# Patient Record
Sex: Female | Born: 1955
Health system: Southern US, Community
[De-identification: ages and names within clinical notes are randomized; demographics above are authoritative.]

## PROBLEM LIST (undated history)

## (undated) DIAGNOSIS — K08409 Partial loss of teeth, unspecified cause, unspecified class: Secondary | ICD-10-CM

## (undated) HISTORY — DX: Partial loss of teeth, unspecified cause, unspecified class: K08.409

---

## 2005-09-18 ENCOUNTER — Other Ambulatory Visit: Admission: RE | Admit: 2005-09-18 | Discharge: 2005-09-18 | Payer: Self-pay | Admitting: Obstetrics and Gynecology

## 2013-07-27 ENCOUNTER — Ambulatory Visit (INDEPENDENT_AMBULATORY_CARE_PROVIDER_SITE_OTHER): Payer: BC Managed Care – PPO | Admitting: Emergency Medicine

## 2013-07-27 VITALS — BP 110/80 | HR 86 | Temp 98.0°F | Resp 16 | Ht 59.0 in | Wt 133.0 lb

## 2013-07-27 DIAGNOSIS — J018 Other acute sinusitis: Secondary | ICD-10-CM

## 2013-07-27 DIAGNOSIS — J209 Acute bronchitis, unspecified: Secondary | ICD-10-CM

## 2013-07-27 MED ORDER — PSEUDOEPHEDRINE-GUAIFENESIN ER 60-600 MG PO TB12: 1.0000 | ORAL_TABLET | Freq: Two times a day (BID) | ORAL | Status: AC

## 2013-07-27 MED ORDER — HYDROCOD POLST-CHLORPHEN POLST 10-8 MG/5ML PO LQCR: 5.0000 mL | Freq: Two times a day (BID) | ORAL | Status: DC | PRN

## 2013-07-27 MED ORDER — AMOXICILLIN-POT CLAVULANATE 875-125 MG PO TABS: 1.0000 | ORAL_TABLET | Freq: Two times a day (BID) | ORAL | Status: DC

## 2013-07-27 NOTE — Patient Instructions (Signed)

## 2013-07-27 NOTE — Progress Notes (Signed)
[facility] [address], [place] [address] [phone]  Date:  07/27/2013   Name:  Jodi Chambers   DOB:  15-Jan-1956   MRN:  [id number]  PCP:  No PCP Per Patient    Chief Complaint: Nasal Congestion   History of Present Illness:  HELYN SCHWAN is a 57 y.o. very pleasant female patient who presents with the following:  Ill since Sunday with nasal congestion and no drainage. Has some post nasal drip.  Sore throat.  Has a nonproductive cough.  No nausea or vomiting.  No wheezing or shortness of breath.  Ill contacts at work.  Myalgias and fatigue.  Malaise.  No improvement with over the counter medications or other home remedies. Denies other complaint or health concern today.   There are no active problems to display for this patient.   History reviewed. No pertinent past medical history.  Past Surgical History  Procedure Laterality Date  . Cesarean section      History  Substance Use Topics  . Smoking status: Never Smoker   . Smokeless tobacco: Not on file  . Alcohol Use: Yes    History reviewed. No pertinent family history.  No Known Allergies  Medication list has been reviewed and updated.  No current outpatient prescriptions on file prior to visit.   No current facility-administered medications on file prior to visit.    Review of Systems:  As per HPI, otherwise negative.    Physical Examination: Filed Vitals:   07/27/13 1431  BP: 110/80  Pulse: 86  Temp: 98 F (36.7 C)  Resp: 16   Filed Vitals:   07/27/13 1431  Height: 4\' 11"  (1.499 m)  Weight: 133 lb (60.328 kg)   Body mass index is 26.85 kg/(m^2). Ideal Body Weight: Weight in (lb) to have BMI = 25: 123.5  GEN: WDWN, NAD, Non-toxic, A & O x 3 HEENT: Atraumatic, Normocephalic. Neck supple. No masses, No LAD. Ears and Nose: No external deformity.  TM negative.  Purulent nasal drainage CV: RRR, No M/G/R. No JVD. No thrill. No extra heart sounds. PULM: CTA B, no  wheezes, crackles, rhonchi. No retractions. No resp. distress. No accessory muscle use. ABD: S, NT, ND, +BS. No rebound. No HSM. EXTR: No c/c/e NEURO Normal gait.  PSYCH: Normally interactive. Conversant. Not depressed or anxious appearing.  Calm demeanor.    Assessment and Plan: Sinusitis Bronchitis augmentin muncinex tussionex   Signed,  Phillips Odor, MD

## 2014-07-03 ENCOUNTER — Other Ambulatory Visit: Payer: Self-pay

## 2015-09-19 ENCOUNTER — Ambulatory Visit: Payer: Self-pay | Admitting: Physician Assistant

## 2017-03-20 DIAGNOSIS — K08409 Partial loss of teeth, unspecified cause, unspecified class: Secondary | ICD-10-CM

## 2017-03-20 HISTORY — PX: TOOTH EXTRACTION: SUR596

## 2017-03-20 HISTORY — DX: Partial loss of teeth, unspecified cause, unspecified class: K08.409

## 2017-10-23 ENCOUNTER — Ambulatory Visit (INDEPENDENT_AMBULATORY_CARE_PROVIDER_SITE_OTHER): Payer: Commercial Managed Care - PPO | Admitting: Physician Assistant

## 2017-10-23 ENCOUNTER — Ambulatory Visit (INDEPENDENT_AMBULATORY_CARE_PROVIDER_SITE_OTHER): Payer: Commercial Managed Care - PPO

## 2017-10-23 ENCOUNTER — Encounter: Payer: Self-pay | Admitting: Physician Assistant

## 2017-10-23 ENCOUNTER — Other Ambulatory Visit: Payer: Self-pay

## 2017-10-23 VITALS — BP 112/84 | HR 99 | Temp 97.8°F | Resp 16 | Ht 59.0 in | Wt 156.8 lb

## 2017-10-23 DIAGNOSIS — R05 Cough: Secondary | ICD-10-CM

## 2017-10-23 DIAGNOSIS — R059 Cough, unspecified: Secondary | ICD-10-CM

## 2017-10-23 DIAGNOSIS — R053 Chronic cough: Secondary | ICD-10-CM

## 2017-10-23 LAB — POCT CBC
Granulocyte percent: 60.2 %G (ref 37–80)
HCT, POC: 43.9 % (ref 37.7–47.9)
Hemoglobin: 14.8 g/dL (ref 12.2–16.2)
Lymph, poc: 2.7 (ref 0.6–3.4)
MCH: 32.7 pg — AB (ref 27–31.2)
MCHC: 33.6 g/dL (ref 31.8–35.4)
MCV: 97.2 fL — AB (ref 80–97)
MID (CBC): 0.2 (ref 0–0.9)
MPV: 8.2 fL (ref 0–99.8)
POC Granulocyte: 4.5 (ref 2–6.9)
POC LYMPH PERCENT: 36.7 %L (ref 10–50)
POC MID %: 3.1 %M (ref 0–12)
Platelet Count, POC: 291 10*3/uL (ref 142–424)
RBC: 4.52 M/uL (ref 4.04–5.48)
RDW, POC: 14 %
WBC: 7.4 10*3/uL (ref 4.6–10.2)

## 2017-10-23 MED ORDER — FAMOTIDINE 40 MG PO TABS: 40.0000 mg | ORAL_TABLET | Freq: Every day | ORAL | 0 refills | Status: DC

## 2017-10-23 NOTE — Patient Instructions (Addendum)
  Start the and continue the medication I have prescribed.  Please elevate the head of the bed by about 6 inches either with pillows under your mattress or encyclopedia's under the bed post. See you back in one month.     IF you received an x-ray today, you will receive an invoice from [facility]. Please contact [facility] at [phone] with questions or concerns regarding your invoice.   IF you received labwork today, you will receive an invoice from [facility]. Please contact [facility] at [phone] with questions or concerns regarding your invoice.   Our billing staff will not be able to assist you with questions regarding bills from these companies.  You will be contacted with the lab results as soon as they are available. The fastest way to get your results is to activate your My Chart account. Instructions are located on the last page of this paperwork. If you have not heard from us regarding the results in 2 weeks, please contact this office.

## 2017-10-23 NOTE — Progress Notes (Signed)
10/23/2017 11:48 AM   DOB: 1956/01/30 / MRN: [id number]  SUBJECTIVE:  Jodi Chambers is a 62 y.o. female presenting for cough that has been present for months now and tells me this gets worse after she eats. She denies dysphagia or odynophagia.  Denies weight loss. Denies any blood in the stool.  Take exedrine maybe one time a month for a HA. Denies a history of ulcer. Quit smoking 20 years ago and smoked roughly 22 years at roughly 1/2 pack.   She has No Known Allergies.   She  has a past medical history of H/O tooth extraction (03/2017).    She  reports that  has never smoked. she has never used smokeless tobacco. She reports that she drinks alcohol. She reports that she does not use drugs. She  reports that she currently engages in sexual activity. The patient  has a past surgical history that includes Cesarean section and Tooth extraction (03/2017).  Her family history is not on file.  Review of Systems  Constitutional: Negative for chills, diaphoresis and fever.  Eyes: Negative.   Respiratory: Positive for cough. Negative for hemoptysis, sputum production, shortness of breath and wheezing.   Cardiovascular: Negative for chest pain, orthopnea and leg swelling.  Gastrointestinal: Negative for abdominal pain, blood in stool, constipation, diarrhea, heartburn, melena, nausea and vomiting.  Genitourinary: Negative for flank pain.  Skin: Negative for rash.  Neurological: Negative for dizziness, sensory change, speech change, focal weakness and headaches.    The problem list and medications were reviewed and updated by myself where necessary and exist elsewhere in the encounter.   OBJECTIVE:  BP 112/84   Pulse 99   Temp 97.8 F (36.6 C)   Resp 16   Ht 4\' 11"  (1.499 m)   Wt 156 lb 12.8 oz (71.1 kg)   SpO2 98%   BMI 31.67 kg/m   Physical Exam  Constitutional: She is active.  Non-toxic appearance.  Cardiovascular: Normal rate, regular rhythm, S1 normal, S2 normal, normal  heart sounds and intact distal pulses. Exam reveals no gallop, no friction rub and no decreased pulses.  No murmur heard. Pulmonary/Chest: Effort normal. No stridor. No tachypnea. No respiratory distress. She has no wheezes. She has no rales.  Abdominal: She exhibits no distension.  Abdomen obese.   Musculoskeletal: She exhibits no edema.  Neurological: She is alert.  Skin: Skin is warm and dry. She is not diaphoretic. No pallor.    No results found for this or any previous visit (from the past 72 hour(s)).  Dg Chest 2 View  Result Date: 10/23/2017 CLINICAL DATA:  Chronic cough.  Remote smoking history. EXAM: CHEST  2 VIEW COMPARISON:  None. FINDINGS: The heart size and mediastinal contours are normal. The lungs are clear aside from possible mild central airway thickening, best seen on the lateral view. There is no hyperinflation, pneumothorax or pleural effusion. The bones appear unremarkable. IMPRESSION: Possible mild central airway thickening, suggesting bronchitis. No evidence of pneumonia. Electronically Signed   By: Carey Bullocks M.D.   On: 10/23/2017 11:37    ASSESSMENT AND PLAN:  Jodi Chambers was seen today for annual exam.  Diagnoses and all orders for this visit:  Chronic cough: Will see her back in about 1 month to check her progress. No other therapies at this time.  -     DG Chest 2 View; Future -     H. pylori breath test -     POCT CBC -  Basic Metabolic Panel -     TSH -     famotidine (PEPCID) 40 MG tablet; Take 1 tablet (40 mg total) by mouth at bedtime.    The patient is advised to call or return to clinic if she does not see an improvement in symptoms, or to seek the care of the closest emergency department if she worsens with the above plan.   Deliah BostonMichael Clark, MHS, PA-C Primary Care at [facility] 10/23/2017 11:48 AM

## 2017-10-23 NOTE — Progress Notes (Deleted)
    10/23/2017 11:45 AM   DOB: Mar 24, 1956 / MRN: [id number]  SUBJECTIVE:  Jodi Chambers is a 62 y.o. female presenting for chronic cough. This is worse She has No Known Allergies.   She  has a past medical history of H/O tooth extraction (03/2017).    She  reports that  has never smoked. she has never used smokeless tobacco. She reports that she drinks alcohol. She reports that she does not use drugs. She  reports that she currently engages in sexual activity. The patient  has a past surgical history that includes Cesarean section and Tooth extraction (03/2017).  Her family history is not on file.  ROS  The problem list and medications were reviewed and updated by myself where necessary and exist elsewhere in the encounter.   OBJECTIVE:  BP 112/84   Pulse 99   Temp 97.8 F (36.6 C)   Resp 16   Ht 4\' 11"  (1.499 m)   Wt 156 lb 12.8 oz (71.1 kg)   SpO2 98%   BMI 31.67 kg/m   Physical Exam  No results found for this or any previous visit (from the past 72 hour(s)).  Dg Chest 2 View  Result Date: 10/23/2017 CLINICAL DATA:  Chronic cough.  Remote smoking history. EXAM: CHEST  2 VIEW COMPARISON:  None. FINDINGS: The heart size and mediastinal contours are normal. The lungs are clear aside from possible mild central airway thickening, best seen on the lateral view. There is no hyperinflation, pneumothorax or pleural effusion. The bones appear unremarkable. IMPRESSION: Possible mild central airway thickening, suggesting bronchitis. No evidence of pneumonia. Electronically Signed   By: Carey BullocksWilliam  Veazey M.D.   On: 10/23/2017 11:37    ASSESSMENT AND PLAN:  Jodi Chambers was seen today for annual exam.  Diagnoses and all orders for this visit:  Chronic cough -     DG Chest 2 View; Future -     H. pylori breath test -     POCT CBC -     Basic Metabolic Panel -     TSH    The patient is advised to call or return to clinic if she does not see an improvement in symptoms, or to seek the  care of the closest emergency department if she worsens with the above plan.   Deliah BostonMichael Jade Burright, MHS, PA-C Primary Care at [facility] 10/23/2017 11:45 AM

## 2017-10-24 LAB — BASIC METABOLIC PANEL
BUN/Creatinine Ratio: 13 (ref 12–28)
BUN: 9 mg/dL (ref 8–27)
CO2: 21 mmol/L (ref 20–29)
CREATININE: 0.67 mg/dL (ref 0.57–1.00)
Calcium: 9.9 mg/dL (ref 8.7–10.3)
Chloride: 98 mmol/L (ref 96–106)
GFR calc Af Amer: 110 mL/min/{1.73_m2} (ref >59)
GFR calc non Af Amer: 96 mL/min/{1.73_m2} (ref >59)
GLUCOSE: 102 mg/dL — AB (ref 65–99)
Potassium: 4.4 mmol/L (ref 3.5–5.2)
SODIUM: 139 mmol/L (ref 134–144)

## 2017-10-24 LAB — TSH: TSH: 3.48 u[IU]/mL (ref 0.450–4.500)

## 2017-10-27 LAB — H. PYLORI BREATH TEST: H pylori Breath Test: NEGATIVE

## 2017-11-20 ENCOUNTER — Ambulatory Visit: Payer: Commercial Managed Care - PPO | Admitting: Physician Assistant

## 2017-11-20 ENCOUNTER — Other Ambulatory Visit: Payer: Self-pay

## 2017-11-20 ENCOUNTER — Encounter: Payer: Self-pay | Admitting: Physician Assistant

## 2017-11-20 VITALS — BP 130/82 | HR 96 | Resp 16 | Ht 59.0 in | Wt 156.8 lb

## 2017-11-20 DIAGNOSIS — Z13228 Encounter for screening for other metabolic disorders: Secondary | ICD-10-CM

## 2017-11-20 DIAGNOSIS — Z1239 Encounter for other screening for malignant neoplasm of breast: Secondary | ICD-10-CM

## 2017-11-20 DIAGNOSIS — Z130 Encounter for screening for diseases of the blood and blood-forming organs and certain disorders involving the immune mechanism: Secondary | ICD-10-CM

## 2017-11-20 DIAGNOSIS — Z1329 Encounter for screening for other suspected endocrine disorder: Secondary | ICD-10-CM

## 2017-11-20 DIAGNOSIS — Z1231 Encounter for screening mammogram for malignant neoplasm of breast: Secondary | ICD-10-CM

## 2017-11-20 DIAGNOSIS — Z1321 Encounter for screening for nutritional disorder: Secondary | ICD-10-CM

## 2017-11-20 DIAGNOSIS — Z1211 Encounter for screening for malignant neoplasm of colon: Secondary | ICD-10-CM | POA: Diagnosis not present

## 2017-11-20 DIAGNOSIS — Z0000 Encounter for general adult medical examination without abnormal findings: Secondary | ICD-10-CM

## 2017-11-20 MED ORDER — ZOSTER VAC RECOMB ADJUVANTED 50 MCG/0.5ML IM SUSR: 0.5000 mL | Freq: Once | INTRAMUSCULAR | 1 refills | Status: AC

## 2017-11-20 MED ORDER — PANTOPRAZOLE SODIUM 40 MG PO TBEC: 40.0000 mg | DELAYED_RELEASE_TABLET | Freq: Every day | ORAL | 3 refills | Status: DC

## 2017-11-20 NOTE — Progress Notes (Signed)
11/20/2017 11:37 AM   DOB: 1955/12/16 / MRN: [id number]  SUBJECTIVE:  Jodi Chambers is a 62 y.o. female presenting for annual exam. She had a pap smear about 1 year ago and normal. She sees [facility] for this. She did have polyps removed about ten years ago.She has not had ultrasounds of her breast yet. She denies a family history of breast cancer.  She has not had a colonoscopy. She likes to exercise via elliptical when she can.  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 She drinks white wine a few times a week. She may drink up to 3 to 4 at one time but this is rare. She lives by herself. She has son that is thirty. He is healthy and is doing well. She sleeps well at night.   She denies a history of CAD.       Depression screen PHQ 2/9 11/20/2017  Decreased Interest 0  Down, Depressed, Hopeless 0  PHQ - 2 Score 0      She has No Known Allergies.   She  has a past medical history of H/O tooth extraction (03/2017).    She  reports that she quit smoking about 20 years ago. Her smoking use included cigarettes. She has a 5.61 pack-year smoking history. she has never used smokeless tobacco. She reports that she drinks alcohol. She reports that she does not use drugs. She  reports that she currently engages in sexual activity. The patient  has a past surgical history that includes Cesarean section and Tooth extraction (03/2017).  Her family history is not on file.  Review of Systems  Constitutional: Negative for chills, diaphoresis and fever.  Respiratory: Negative for cough, hemoptysis, sputum production, shortness of breath and wheezing.   Cardiovascular: Negative for chest pain, orthopnea and leg swelling.  Gastrointestinal: Negative for nausea.  Skin: Negative for rash.  Neurological: Negative for dizziness.    The problem list and medications were reviewed and updated by myself  where necessary and exist elsewhere in the encounter.   OBJECTIVE:  BP 130/82 (BP Location: Left Arm, Patient Position: Sitting, Cuff Size: Normal)   Pulse 96   Resp 16   Ht 4\' 11"  (1.499 m)   Wt 156 lb 12.8 oz (71.1 kg)   SpO2 99%   BMI 31.67 kg/m   Physical Exam  Constitutional: She is active.  Non-toxic appearance.  Cardiovascular: Normal rate, regular rhythm, S1 normal, S2 normal, normal heart sounds and intact distal pulses. Exam reveals no gallop, no friction rub and no decreased pulses.  No murmur heard. Pulmonary/Chest: Effort normal. No stridor. No tachypnea. No respiratory distress. She has no wheezes. She has no rales.  Abdominal: She exhibits no distension.  Musculoskeletal: She exhibits no edema.  Neurological: She is alert.  Skin: Skin is warm and dry. She is not diaphoretic. No pallor.    No results found for this or any previous visit (from the past 72 hour(s)).  No results found.  ASSESSMENT AND PLAN:  Jodi Chambers was seen today for cough.  Diagnoses and all orders for this visit:  Annual physical exam  Screening for breast cancer -     MM DIGITAL SCREENING BILATERAL; Future  Special screening for malignant neoplasms, colon -     Cologuard  Screening for endocrine, nutritional, metabolic and immunity disorder -  Hemoglobin A1c -     Lipid Panel  Other orders -     pantoprazole (PROTONIX) 40 MG tablet; Take 1 tablet (40 mg total) by mouth daily. -     Zoster Vaccine Adjuvanted [facility]) injection; Inject 0.5 mLs into the muscle once for 1 dose. Due for booster in 6 months.    The patient is advised to call or return to clinic if she does not see an improvement in symptoms, or to seek the care of the closest emergency department if she worsens with the above plan.   Deliah Boston, MHS, PA-C Primary Care at [facility] 11/20/2017 11:37 AM

## 2017-11-20 NOTE — Patient Instructions (Addendum)
Take the pantoprazole 30 minutes before breakfast.  Okay to stop the famotidine in the next 3 or 4 days as the pantoprazole will take over the action of the famotidine at night.  If your cough continues despite taking pantoprazole to let start taking Zyrtec in about 4 weeks to see if this makes an impact in your symptoms.  If in 6 weeks you come back and you are still coughing and it is unclear why I will refer you to a pulmonologist.  With regard to your annual exam we are screening your A1c for diabetes, your lipids or cholesterol.  Please complete your Cologaurd.  Please get your mammogram.      IF you received an x-ray today, you will receive an invoice from [facility]. Please contact [facility] at [phone] with questions or concerns regarding your invoice.   IF you received labwork today, you will receive an invoice from [facility]. Please contact [facility] at [phone] with questions or concerns regarding your invoice.   Our billing staff will not be able to assist you with questions regarding bills from these companies.  You will be contacted with the lab results as soon as they are available. The fastest way to get your results is to activate your My Chart account. Instructions are located on the last page of this paperwork. If you have not heard from us regarding the results in 2 weeks, please contact this office.

## 2017-11-21 ENCOUNTER — Encounter: Payer: Self-pay | Admitting: Physician Assistant

## 2017-11-21 LAB — HEMOGLOBIN A1C
Est. average glucose Bld gHb Est-mCnc: 108 mg/dL
Hgb A1c MFr Bld: 5.4 % (ref 4.8–5.6)

## 2017-11-21 LAB — LIPID PANEL
CHOL/HDL RATIO: 4 ratio (ref 0.0–4.4)
CHOLESTEROL TOTAL: 260 mg/dL — AB (ref 100–199)
HDL: 65 mg/dL (ref >39)
LDL CALC: 160 mg/dL — AB (ref 0–99)
TRIGLYCERIDES: 174 mg/dL — AB (ref 0–149)
VLDL Cholesterol Cal: 35 mg/dL (ref 5–40)

## 2017-11-21 NOTE — Progress Notes (Signed)
Sherese please send her a letter.  Her labs look great.  There is some mild abnormalities in her lipids but her ASCVD score is too low to start her on cholesterol medication.

## 2018-06-20 IMAGING — DX DG CHEST 2V
2 series · 2 of 2 positions shown · non-contrast
Comparison: None.

CLINICAL DATA: Chronic cough.  Remote smoking history.

EXAM:
CHEST  2 VIEW

[chest pa]
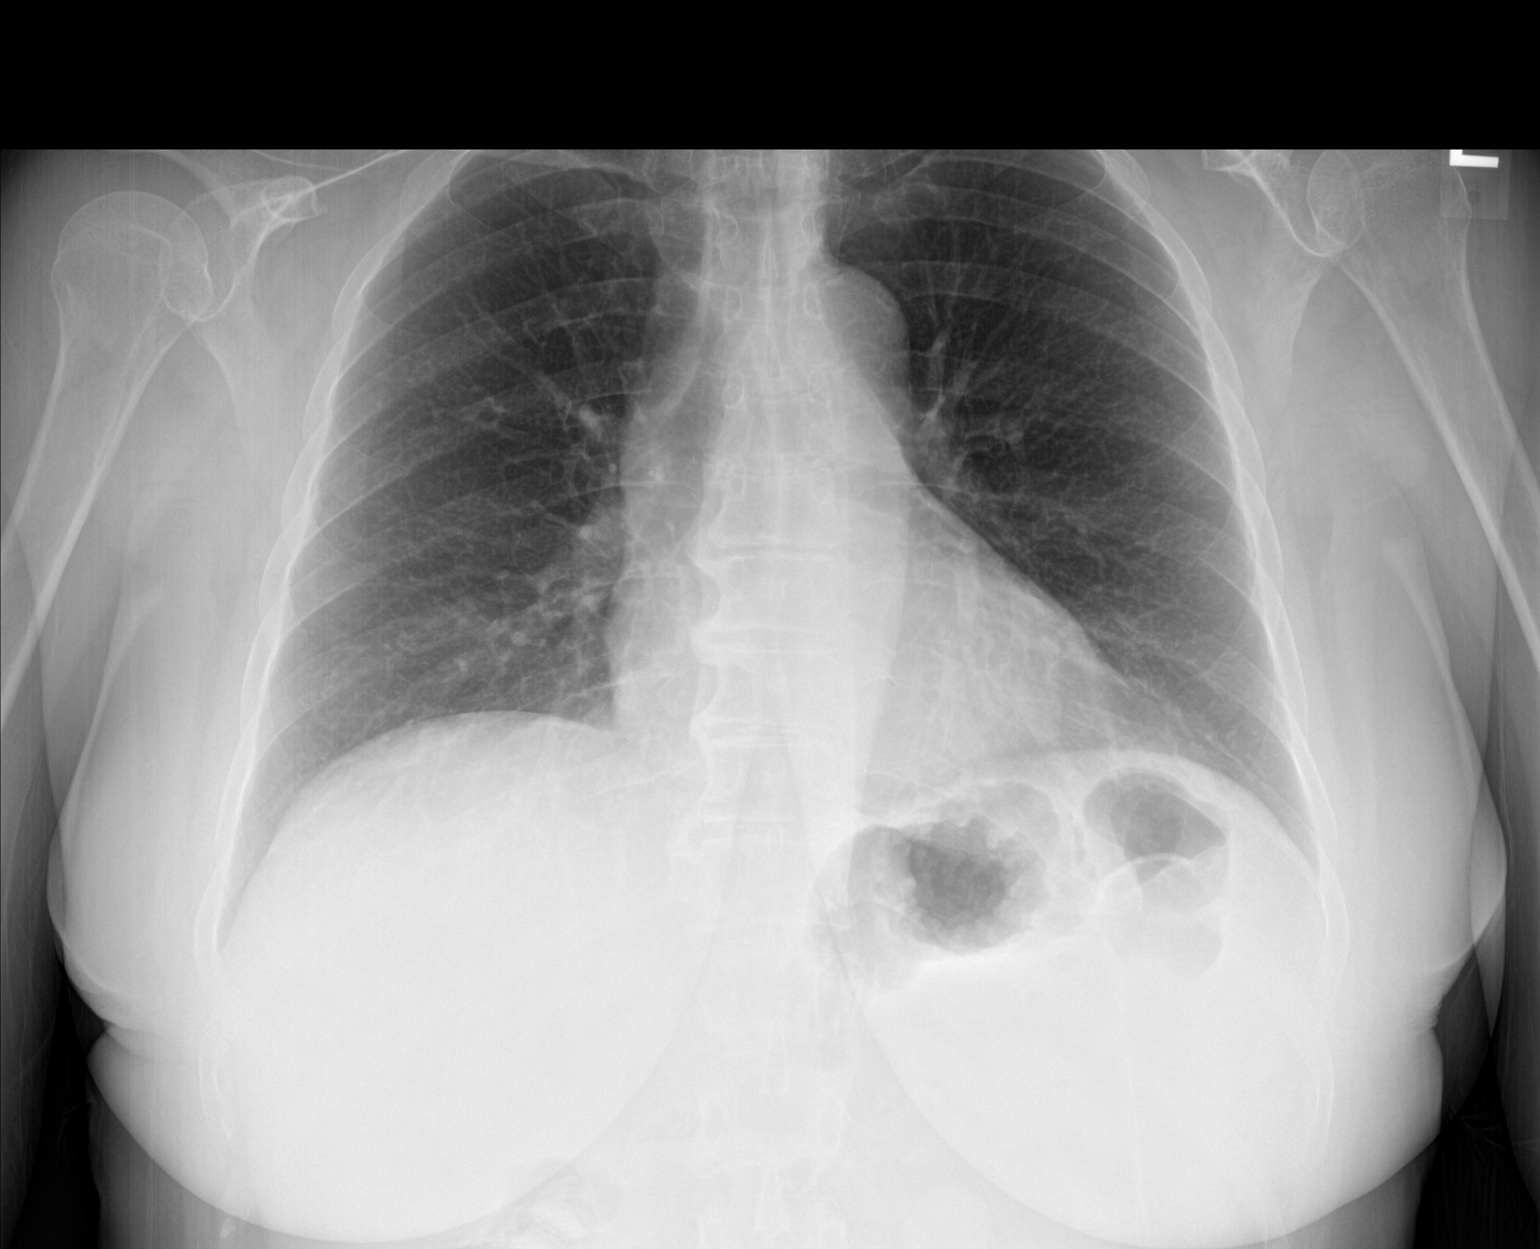

[chest lat]
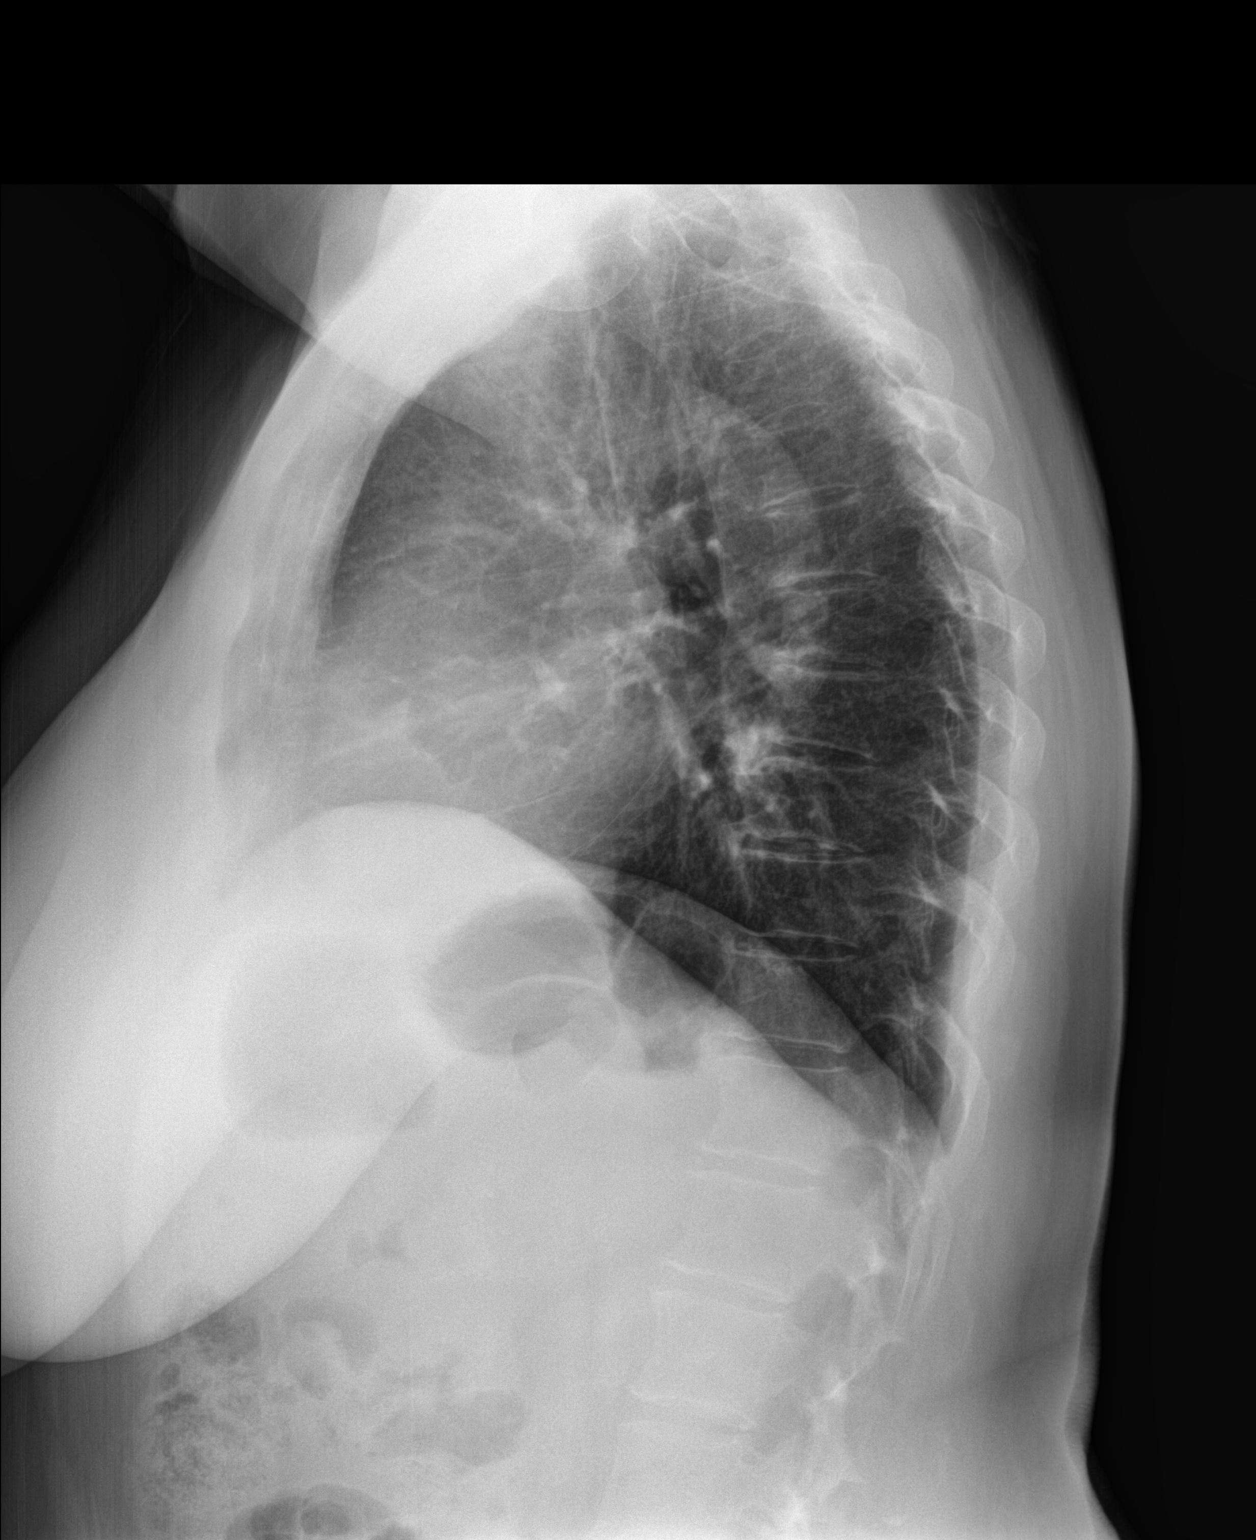

[2 of 2 positions shown; findings below may reference images not displayed]

FINDINGS: The heart size and mediastinal contours are normal. The lungs are
clear aside from possible mild central airway thickening, best seen
on the lateral view. There is no hyperinflation, pneumothorax or
pleural effusion. The bones appear unremarkable.
IMPRESSION: Possible mild central airway thickening, suggesting bronchitis. No
evidence of pneumonia.

## 2022-10-10 DIAGNOSIS — Z6832 Body mass index (BMI) 32.0-32.9, adult: Secondary | ICD-10-CM | POA: Diagnosis not present

## 2022-10-10 DIAGNOSIS — R6889 Other general symptoms and signs: Secondary | ICD-10-CM | POA: Diagnosis not present

## 2022-10-17 DIAGNOSIS — Z6831 Body mass index (BMI) 31.0-31.9, adult: Secondary | ICD-10-CM | POA: Diagnosis not present

## 2022-10-17 DIAGNOSIS — R6889 Other general symptoms and signs: Secondary | ICD-10-CM | POA: Diagnosis not present

## 2022-12-17 DIAGNOSIS — R399 Unspecified symptoms and signs involving the genitourinary system: Secondary | ICD-10-CM | POA: Diagnosis not present

## 2022-12-17 DIAGNOSIS — Z6831 Body mass index (BMI) 31.0-31.9, adult: Secondary | ICD-10-CM | POA: Diagnosis not present

## 2022-12-17 DIAGNOSIS — R030 Elevated blood-pressure reading, without diagnosis of hypertension: Secondary | ICD-10-CM | POA: Diagnosis not present

## 2023-01-01 DIAGNOSIS — R399 Unspecified symptoms and signs involving the genitourinary system: Secondary | ICD-10-CM | POA: Diagnosis not present

## 2023-03-12 DIAGNOSIS — R399 Unspecified symptoms and signs involving the genitourinary system: Secondary | ICD-10-CM | POA: Diagnosis not present

## 2023-03-19 DIAGNOSIS — R102 Pelvic and perineal pain: Secondary | ICD-10-CM | POA: Diagnosis not present

## 2023-03-19 DIAGNOSIS — Z6829 Body mass index (BMI) 29.0-29.9, adult: Secondary | ICD-10-CM | POA: Diagnosis not present

## 2023-04-22 ENCOUNTER — Ambulatory Visit: Payer: PPO | Admitting: Obstetrics and Gynecology

## 2023-04-22 ENCOUNTER — Other Ambulatory Visit (HOSPITAL_COMMUNITY)
Admission: RE | Admit: 2023-04-22 | Discharge: 2023-04-22 | Disposition: A | Discharge status: Home / Self Care | Payer: PPO | Source: Other Acute Inpatient Hospital | Attending: Obstetrics and Gynecology | Admitting: Obstetrics and Gynecology

## 2023-04-22 ENCOUNTER — Encounter: Payer: Self-pay | Admitting: Obstetrics and Gynecology

## 2023-04-22 VITALS — BP 164/96 | HR 83 | Ht <= 58 in | Wt 145.6 lb

## 2023-04-22 DIAGNOSIS — N8110 Cystocele, unspecified: Secondary | ICD-10-CM

## 2023-04-22 DIAGNOSIS — N952 Postmenopausal atrophic vaginitis: Secondary | ICD-10-CM

## 2023-04-22 DIAGNOSIS — R350 Frequency of micturition: Secondary | ICD-10-CM | POA: Diagnosis not present

## 2023-04-22 DIAGNOSIS — N393 Stress incontinence (female) (male): Secondary | ICD-10-CM

## 2023-04-22 DIAGNOSIS — R82998 Other abnormal findings in urine: Secondary | ICD-10-CM | POA: Diagnosis not present

## 2023-04-22 LAB — POCT URINALYSIS DIPSTICK
Bilirubin, UA: NEGATIVE
Blood, UA: NEGATIVE
Glucose, UA: NEGATIVE
Ketones, UA: NEGATIVE
Nitrite, UA: NEGATIVE
Protein, UA: NEGATIVE
Spec Grav, UA: 1.02 (ref 1.010–1.025)
Urobilinogen, UA: 0.2 E.U./dL
pH, UA: 7 (ref 5.0–8.0)

## 2023-04-22 MED ORDER — ESTRADIOL 0.1 MG/GM VA CREA
0.5000 g | TOPICAL_CREAM | VAGINAL | 11 refills | Status: AC
Start: 2023-04-23 — End: ?

## 2023-04-22 NOTE — Patient Instructions (Addendum)
You have stage 1 out of 4 Anterior vaginal prolapse.   Information on pessary included here. Consider   Start vaginal estrogen cream nightly for two weeks and then twice weekly after. Use a blueberry sized amount onto the finger and into the vagina and around the clitoris and urethra.   Information on urethral bulking included here as well.

## 2023-04-22 NOTE — Progress Notes (Signed)
[facility] New Patient Evaluation and Consultation  Referring Provider: Inez Pilgrim, NP PCP: Jodi Pilgrim, NP Date of Service: 04/22/2023  SUBJECTIVE Chief Complaint: New Patient (Initial Visit) Jodi Chambers is a 67 y.o. female is here for prolapse)  History of Present Illness: Jodi Chambers is a 67 y.o. White or Caucasian female seen in consultation at the request of Dr. Sallee Chambers for evaluation of pelvic organ prolapse and recurrent UTI.    Review of records significant for: Taking D-mannose   Urinary Symptoms: Does not leak urine.    Day time voids 4.  Nocturia: 1-2 times per night to void. Voiding dysfunction: she empties her bladder well.  does not use a catheter to empty bladder.  When urinating, she feels dribbling after finishing Drinks: 2 cups of coffee in AM, Prune juice, and 4-6 cups Water per day  UTIs: 4 UTI's in the last year.   Denies history of blood in urine, kidney or bladder stones, pyelonephritis, bladder cancer, and kidney cancer  Pelvic Organ Prolapse Symptoms:                  She Admits to a feeling of a bulge the vaginal area sometimes. It has been present for 6 months.  She Denies seeing a bulge.  This bulge is not bothersome.  Bowel Symptom: Bowel movements: 1 time(s) per day Stool consistency: soft  Straining: yes.  Splinting: no.  Incomplete evacuation: yes.  She Denies accidental bowel leakage / fecal incontinence Bowel regimen: fiber Last colonoscopy: Never  Sexual Function Sexually active: no.  Sexual orientation: Straight Pain with sex: No  Pelvic Pain Denies pelvic pain    Past Medical History:  Past Medical History:  Diagnosis Date   H/O tooth extraction 03/2017     Past Surgical History:   Past Surgical History:  Procedure Laterality Date   CESAREAN SECTION     TOOTH EXTRACTION  03/2017     Past OB/GYN History: G1 P1 Vaginal deliveries: 0,  Forceps/ Vacuum deliveries: 0, Cesarean  section: 1 Menopausal: Yes, at age 68 Last pap smear was 2008.  Any history of abnormal pap smears: no.   Medications: She has a current medication list which includes the following prescription(s): d-mannose, [START ON 04/23/2023] estradiol, ergocalciferol, multi for her, and oyster shell calcium.   Allergies: Patient has No Known Allergies.   Social History:  Social History   Tobacco Use   Smoking status: Former    Packs/day: 0.33    Years: 17.00    Additional pack years: 0.00    Total pack years: 5.61    Types: Cigarettes    Quit date: 10/20/1997    Years since quitting: 25.5   Smokeless tobacco: Never  Vaping Use   Vaping Use: Never used  Substance Use Topics   Alcohol use: Yes    Alcohol/week: 2.0 standard drinks of alcohol    Types: 2 Glasses of wine per week   Drug use: No    Relationship status: single She lives alone.   She is employed in administration. Regular exercise: Yes: walking and gym History of abuse: No  Family History:   Family History  Problem Relation Age of Onset   Heart disease Mother    Dementia Father      Review of Systems: Review of Systems  Constitutional:  Negative for chills, fever and malaise/fatigue.  Respiratory:  Positive for cough. Negative for shortness of breath and wheezing.   Gastrointestinal:  Negative for abdominal pain,  blood in stool and constipation.  Genitourinary:  Positive for frequency. Negative for dysuria and urgency.  Musculoskeletal:  Negative for myalgias.  Neurological:  Negative for dizziness, seizures and weakness.  Psychiatric/Behavioral:  Negative for depression and suicidal ideas. The patient is not nervous/anxious.      OBJECTIVE Physical Exam: Vitals:   04/22/23 1050 04/22/23 1056  BP: (!) 162/94 (!) 164/96  Pulse: 94 83  Weight: 145 lb 9.6 oz (66 kg)   Height: 4\' 10"  (1.473 m)     Physical Exam Constitutional:      Appearance: Normal appearance.  Pulmonary:     Effort: Pulmonary effort is  normal.  Abdominal:     General: Abdomen is flat.  Skin:    General: Skin is warm and dry.  Neurological:     Mental Status: She is alert.  Psychiatric:        Mood and Affect: Mood normal.        Behavior: Behavior normal.        Thought Content: Thought content normal.        Judgment: Judgment normal.      GU / Detailed Urogynecologic Evaluation:  Pelvic Exam: Normal external female genitalia; Bartholin's and Skene's glands normal in appearance; urethral meatus normal in appearance, no urethral masses or discharge.   CST: positive  Speculum exam reveals normal vaginal mucosa with atrophy. Cervix normal appearance. Uterus normal single, nontender. Adnexa normal adnexa.      With apex supported, anterior compartment defect was reduced  Pelvic floor strength I/V  Pelvic floor musculature: Right levator non-tender, Right obturator non-tender, Left levator non-tender, Left obturator non-tender  POP-Q:   POP-Q  -1.5                                            Aa   -1.5                                           Ba  -6                                              C   3.5                                            Gh  4                                            Pb  7.5                                            tvl   -2  Ap  -2                                            Bp  6.5                                              D      Rectal Exam:  Normal external rectal exam.  Post-Void Residual (PVR) by Bladder Scan: In order to evaluate bladder emptying, we discussed obtaining a postvoid residual and she agreed to this procedure.  Procedure: The ultrasound unit was placed on the patient's abdomen in the suprapubic region after the patient had voided. A PVR of 91 ml was obtained by bladder scan.  Laboratory Results: POC Urine: Positive for moderate leukocytes, negative for all other components.   ASSESSMENT AND  PLAN Jodi Chambers is a 67 y.o. with:  1. Prolapse of anterior vaginal wall   2. SUI (stress urinary incontinence, female)   3. Vaginal atrophy   4. Leukocytes in urine   5. Urinary frequency    Patient has stage I/IV anterior vaginal wall prolapse. We discussed that if she is not symptomatic of her prolapse she does not have to consider surgical options. She reports sometimes feeling that she does not empty her bladder well. We discussed that we could do a pessary that may assist her in emptying her bladder better. She is mildly interested in a pessary but would like to consider it. Information provided to patient.  Patient did have a positive CST on exam today. She reports she is not overly bothered by this, but we did discuss treatment options so that she knows she has choices for treating leakage. We discussed surgical sling and urethral bulking. She was slightly interested in bulking, so information was given for this. Patient had some moderate vaginal atrophy on exam. As she has had a few UTI's in the past year, encouraged her to start on vaginal estrogen cream. She is open to this idea. Prescription for estrace sent to pharmacy for patient. She is to use it nightly for 2 weeks, then twice a week after. Patient instructed not to use the applicator but to use a blueberry sized amount onto her finger and apply it into the the vagina. We also discussed this is a first line defense against UTI.  Patient has leukocytes with urine. Will send for culture.  OAB symptoms are mild and not bothersome to patients.   Patient to follow up PRN and will call if she wants to pursue pessary insertion or urethral bulking.     Selmer Dominion, NP

## 2023-04-23 LAB — URINE CULTURE: Culture: <10000 — AB

## 2023-05-12 ENCOUNTER — Encounter: Payer: PPO | Admitting: Physical Therapy

## 2023-06-24 NOTE — Therapy (Deleted)
OUTPATIENT PHYSICAL THERAPY FEMALE PELVIC EVALUATION   Patient Name: Jodi Chambers MRN: [id number] DOB:08-Jan-1956, 67 y.o., female Today's Date: 06/24/2023  END OF SESSION:   Past Medical History:  Diagnosis Date   H/O tooth extraction 03/2017   Past Surgical History:  Procedure Laterality Date   CESAREAN SECTION     TOOTH EXTRACTION  03/2017   There are no problems to display for this patient.   PCP: Inez Pilgrim, NP  REFERRING PROVIDER: Selmer Dominion, NP   REFERRING DIAG:  R35.0 (ICD-10-CM) - Urinary frequency  N81.10 (ICD-10-CM) - Prolapse of anterior vaginal wall    THERAPY DIAG:  No diagnosis found.  Rationale for Evaluation and Treatment: Rehabilitation  ONSET DATE: ***  SUBJECTIVE:                                                                                                                                                                                           SUBJECTIVE STATEMENT: 4 UTI's last year Fluid intake:  2 cups of coffee in AM, Prune juice, and 4-6 cups Water per day    PAIN:  Are you having pain? {yes/no:20286} NPRS scale: ***/10 Pain location: {pelvic pain location:27098}  Pain type: {type:313116} Pain description: {PAIN DESCRIPTION:21022940}   Aggravating factors: *** Relieving factors: ***  PRECAUTIONS: {Therapy precautions:24002}  RED FLAGS: {PT Red Flags:29287}   WEIGHT BEARING RESTRICTIONS: {Yes ***/No:24003}  FALLS:  Has patient fallen in last 6 months? {fallsyesno:27318}  LIVING ENVIRONMENT: Lives with: {[facility] lives with:25569::"lives with their family"} Lives in: {Lives in:25570} Stairs: {opstairs:27293} Has following equipment at home: {Assistive devices:23999}  OCCUPATION: ***  PLOF: {PLOF:24004}  PATIENT GOALS: ***  PERTINENT HISTORY:  Cesarean section Sexual abuse: {Yes/No:304960894}  BOWEL MOVEMENT: Pain with bowel movement: {yes/no:20286} Type of bowel movement:Type (Bristol Stool Scale)  ***, Frequency ***, Strain Yes, and Splinting *** Fully empty rectum: No Leakage: {Yes/No:304960894} Pads: {Yes/No:304960894} Fiber supplement: {Yes/No:304960894}  URINATION: Pain with urination: {yes/no:20286} Fully empty bladder: When urinating, she feels dribbling after finishing  Stream: {PT urination:27102} Urgency: {Yes/No:304960894} Frequency: *** Leakage: {PT leakage:27103} Pads: {Yes/No:304960894}  INTERCOURSE:Not active Pain with intercourse: {pain with intercourse PA:27099} Ability to have vaginal penetration:  {Yes/No:304960894} Climax: *** Marinoff Scale: ***/3  PREGNANCY: Vaginal deliveries *** Tearing {Yes***/No:304960894} C-section deliveries 1 Currently pregnant {Yes***/No:304960894}  PROLAPSE: Cystocele stage 1   OBJECTIVE:   DIAGNOSTIC FINDINGS:  Pelvic floor strength I/V  PVR of 91 ml was obtained by bladder scan.  Positive CST  PATIENT SURVEYS:  {rehab surveys:24030}  PFIQ-7 ***  COGNITION: Overall cognitive status: {cognition:24006}     SENSATION: Light touch: {intact/deficits:24005} Proprioception: {intact/deficits:24005}  MUSCLE LENGTH: Hamstrings: Right ***  deg; Left *** deg Maisie Fus test: Right *** deg; Left *** deg  LUMBAR SPECIAL TESTS:  {lumbar special test:25242}  FUNCTIONAL TESTS:  {Functional tests:24029}  GAIT: Distance walked: *** Assistive device utilized: {Assistive devices:23999} Level of assistance: {Levels of assistance:24026} Comments: ***  POSTURE: {posture:25561}  PELVIC ALIGNMENT:  LUMBARAROM/PROM:  A/PROM A/PROM  eval  Flexion   Extension   Right lateral flexion   Left lateral flexion   Right rotation   Left rotation    (Blank rows = not tested)  LOWER EXTREMITY ROM:  {AROM/PROM:27142} ROM Right eval Left eval  Hip flexion    Hip extension    Hip abduction    Hip adduction    Hip internal rotation    Hip external rotation    Knee flexion    Knee extension    Ankle dorsiflexion     Ankle plantarflexion    Ankle inversion    Ankle eversion     (Blank rows = not tested)  LOWER EXTREMITY MMT:  MMT Right eval Left eval  Hip flexion    Hip extension    Hip abduction    Hip adduction    Hip internal rotation    Hip external rotation    Knee flexion    Knee extension    Ankle dorsiflexion    Ankle plantarflexion    Ankle inversion    Ankle eversion     PALPATION:   General  ***                External Perineal Exam ***                             Internal Pelvic Floor ***  Patient confirms identification and approves PT to assess internal pelvic floor and treatment {yes/no:20286}  PELVIC MMT:   MMT eval  Vaginal   Internal Anal Sphincter   External Anal Sphincter   Puborectalis   Diastasis Recti   (Blank rows = not tested)        TONE: ***  PROLAPSE: ***  TODAY'S TREATMENT:                                                                                                                              DATE: ***  EVAL ***   PATIENT EDUCATION:  Education details: *** Person educated: {Person educated:25204} Education method: {Education Method:25205} Education comprehension: {Education Comprehension:25206}  HOME EXERCISE PROGRAM: ***  ASSESSMENT:  CLINICAL IMPRESSION: Patient is a *** y.o. *** who was seen today for physical therapy evaluation and treatment for ***.   OBJECTIVE IMPAIRMENTS: {opptimpairments:25111}.   ACTIVITY LIMITATIONS: {activitylimitations:27494}  PARTICIPATION LIMITATIONS: {participationrestrictions:25113}  PERSONAL FACTORS: {Personal factors:25162} are also affecting patient's functional outcome.   REHAB POTENTIAL: {rehabpotential:25112}  CLINICAL DECISION MAKING: {clinical decision making:25114}  EVALUATION COMPLEXITY: {Evaluation complexity:25115}   GOALS: Goals reviewed with patient? {yes/no:20286}  SHORT TERM GOALS: Target date: ***  ***  Baseline: Goal status: INITIAL  2.  *** Baseline:   Goal status: INITIAL  3.  *** Baseline:  Goal status: INITIAL  4.  *** Baseline:  Goal status: INITIAL  5.  *** Baseline:  Goal status: INITIAL  6.  *** Baseline:  Goal status: INITIAL  LONG TERM GOALS: Target date: ***  *** Baseline:  Goal status: INITIAL  2.  *** Baseline:  Goal status: INITIAL  3.  *** Baseline:  Goal status: INITIAL  4.  *** Baseline:  Goal status: INITIAL  5.  *** Baseline:  Goal status: INITIAL  6.  *** Baseline:  Goal status: INITIAL  PLAN:  PT FREQUENCY: {rehab frequency:25116}  PT DURATION: {rehab duration:25117}  PLANNED INTERVENTIONS: {rehab planned interventions:25118::"Therapeutic exercises","Therapeutic activity","Neuromuscular re-education","Balance training","Gait training","Patient/Family education","Self Care","Joint mobilization"}  PLAN FOR NEXT SESSION: ***   Rashema Seawright, PT 06/24/2023, 9:01 AM

## 2023-06-25 ENCOUNTER — Ambulatory Visit: Payer: PPO | Attending: Obstetrics and Gynecology | Admitting: Physical Therapy

## 2023-06-25 ENCOUNTER — Encounter: Payer: PPO | Admitting: Physical Therapy

## 2023-06-25 ENCOUNTER — Encounter: Payer: Self-pay | Admitting: Physical Therapy

## 2023-06-25 ENCOUNTER — Other Ambulatory Visit: Payer: Self-pay

## 2023-06-25 DIAGNOSIS — M6281 Muscle weakness (generalized): Secondary | ICD-10-CM | POA: Diagnosis not present

## 2023-06-25 DIAGNOSIS — R350 Frequency of micturition: Secondary | ICD-10-CM | POA: Diagnosis not present

## 2023-06-25 DIAGNOSIS — N8110 Cystocele, unspecified: Secondary | ICD-10-CM | POA: Insufficient documentation

## 2023-06-25 NOTE — Therapy (Signed)
OUTPATIENT PHYSICAL THERAPY FEMALE PELVIC EVALUATION   Patient Name: Jodi Chambers MRN: [id number] DOB:[birth year], 67 y.o., female Today's Date: 06/25/2023  END OF SESSION:  PT End of Session - 06/25/23 1403     Visit Number 1    Date for PT Re-Evaluation 08/20/23    Authorization Type healthte[employer]    Authorization - Visit Number 1    Authorization - Number of Visits 10    PT Start Time 1400    PT Stop Time 1445    PT Time Calculation (min) 45 min    Activity Tolerance Patient tolerated treatment well    Behavior During Therapy [facility] for tasks assessed/performed             Past Medical History:  Diagnosis Date   H/O tooth extraction 03/2017   Past Surgical History:  Procedure Laterality Date   CESAREAN SECTION     TOOTH EXTRACTION  03/2017   There are no problems to display for this patient.   PCP: Inez Pilgrim, NP  REFERRING PROVIDER: Selmer Dominion, NP   REFERRING DIAG:  R35.0 (ICD-10-CM) - Urinary frequency  N81.10 (ICD-10-CM) - Prolapse of anterior vaginal wall    THERAPY DIAG:  Muscle weakness (generalized)  Prolapse of anterior vaginal wall  Rationale for Evaluation and Treatment: Rehabilitation  ONSET DATE: 12/23  SUBJECTIVE:                                                                                                                                                                                           SUBJECTIVE STATEMENT: 4 UTI's last year and found out she had stage 1 prolapse. I was feeling pressure when I had to go to the bathroom but not now. I have been using the Estrace cream.  Fluid intake:  2 cups of coffee in AM, Prune juice, and 4-6 cups Water per day    PAIN:  Are you having pain? No   PRECAUTIONS: None  RED FLAGS: None   WEIGHT BEARING RESTRICTIONS: No  FALLS:  Has patient fallen in last 6 months? No  LIVING ENVIRONMENT: Lives with: lives alone  OCCUPATION: work from home; walk daily  PLOF:  Independent  PATIENT GOALS: how to exercise and manage a prolapse.   PERTINENT HISTORY:  Cesarean section  BOWEL MOVEMENT:Managing stool with prunes and fiber  URINATION: Pain with urination: No Fully empty bladder: When urinating, she feels dribbling after finishing  Stream: Strong Urgency: No Frequency: urinates 2-3 hours Leakage: Coughing  INTERCOURSE:Not active   PREGNANCY: C-section deliveries 1  PROLAPSE: Cystocele stage 1   OBJECTIVE:   DIAGNOSTIC FINDINGS:  Pelvic floor strength  I/V  PVR of 91 ml was obtained by bladder scan.  Positive CST   COGNITION: Overall cognitive status: Within functional limits for tasks assessed     SENSATION: Light touch: Appears intact Proprioception: Appears intact   POSTURE: No Significant postural limitations  PELVIC ALIGNMENT:  LUMBARAROM/PROM: full lumbar ROM   LOWER EXTREMITY ROM: full bilateral hip ROM   LOWER EXTREMITY MMT:  MMT Right eval Left eval  Hip extension 4/5 4/5   PALPATION:   General  lower abdominal scar are restricted, difficulty with contracting the lower abdominals.                 External Perineal Exam intact                             Internal Pelvic Floor tightness on the sides of the introitus  Patient confirms identification and approves PT to assess internal pelvic floor and treatment Yes  PELVIC MMT:   MMT eval  Vaginal 2/5 no hug of therapist finger and tightness on the lateral sides  (Blank rows = not tested)        TONE: average  PROLAPSE: Mild anterior wall weakness  TODAY'S TREATMENT:                                                                                                                              DATE: 06/25/23  EVAL See below   PATIENT EDUCATION:  06/25/23 Education details: Access Code: [email or web address] Person educated: Patient Education method: Explanation, Demonstration, Tactile cues, Verbal cues, and Handouts Education comprehension: verbalized  understanding, returned demonstration, verbal cues required, tactile cues required, and needs further education  HOME EXERCISE PROGRAM: 06/25/23 Access Code: [email or web address] URL: [email or web address] Date: 06/25/2023 Prepared by: Eulis Foster  Exercises - Supine Pelvic Floor Contraction  - 3 x daily - 7 x weekly - 1 sets - 5 reps - 5 sec hold - Hooklying Isometric Hip Flexion  - 1 x daily - 7 x weekly - 3 sets - 10 reps - Hooklying Isometric Hip Flexion with Opposite Arm  - 1 x daily - 7 x weekly - 1 sets - 10 reps - 5 sec hold  ASSESSMENT:  CLINICAL IMPRESSION: Patient is a 67 y.o. female who was seen today for physical therapy evaluation and treatment for urinary frequency and anterior wall prolapse. Patient reports she had 4 UTI's last year and the doctor found she had a anterior wall weakness. She will leak urine with a cough on occasion. Pelvic floor strength is 2/5 when therapist is able to have her contract her lower abdominals. She has tightness on the sides of the introitus. She has difficulty with contracting her lower abdominals. The scar on the lower abdomen has restrictions. Patient will benefit from skilled therapy to improve lower abdominal strength, pelvic floor strength and education on prolapse management.   OBJECTIVE IMPAIRMENTS: decreased activity tolerance, decreased endurance, decreased  strength, and increased fascial restrictions.   ACTIVITY LIMITATIONS: continence  PARTICIPATION LIMITATIONS:  exercise  PERSONAL FACTORS: Cesarean section are also affecting patient's functional outcome.   REHAB POTENTIAL: Excellent  CLINICAL DECISION MAKING: Stable/uncomplicated  EVALUATION COMPLEXITY: Low   GOALS: Goals reviewed with patient? Yes  SHORT TERM GOALS: Target date: 07/21/23  Patient is able to contract her lower abdominals with a pelvic floor contraction.  Baseline: Goal status: INITIAL  2.  Patient educated on scar mobilization to improve mobiltiy.   Baseline:  Goal status: INITIAL   LONG TERM GOALS: Target date: 08/20/23  Patient independent with advanced HEP for core and pelvic floor strength.  Baseline:  Goal status: INITIAL  2.  Patient is educated on prolapse management, pressure management to reduce chances of her prolapse progressing.  Baseline:  Goal status: INITIAL  3.  Patient understands on how to exercise at the gym without putting pressure on the prolapse.  Baseline:  Goal status: INITIAL  4.  Pelvic floor strength >/= 3/5 so she has not urinary leakage with coughing.  Baseline:  Goal status: INITIAL   PLAN:  PT FREQUENCY: 1x/week  PT DURATION: 8 weeks  PLANNED INTERVENTIONS: Therapeutic exercises, Therapeutic activity, Neuromuscular re-education, Patient/Family education, Joint mobilization, Dry Needling, Cryotherapy, Moist heat, Ultrasound, Biofeedback, and Manual therapy  PLAN FOR NEXT SESSION: manual work to the urogenital diaphragm and sides of introitus; manual work to scar, diaphragmatic breathing, core strength   Eulis Foster, PT 06/25/23 2:55 PM

## 2023-07-02 ENCOUNTER — Encounter: Payer: PPO | Attending: Obstetrics and Gynecology | Admitting: Physical Therapy

## 2023-07-02 ENCOUNTER — Encounter: Payer: Self-pay | Admitting: Physical Therapy

## 2023-07-02 DIAGNOSIS — M6281 Muscle weakness (generalized): Secondary | ICD-10-CM | POA: Insufficient documentation

## 2023-07-02 DIAGNOSIS — R102 Pelvic and perineal pain: Secondary | ICD-10-CM | POA: Diagnosis not present

## 2023-07-02 DIAGNOSIS — R350 Frequency of micturition: Secondary | ICD-10-CM | POA: Insufficient documentation

## 2023-07-02 DIAGNOSIS — N8110 Cystocele, unspecified: Secondary | ICD-10-CM | POA: Diagnosis not present

## 2023-07-02 NOTE — Therapy (Signed)
OUTPATIENT PHYSICAL THERAPY FEMALE PELVIC TREATMENT   Patient Name: Jodi Chambers MRN: [id number] DOB:1956-09-06, 67 y.o., female Today's Date: 07/02/2023  END OF SESSION:  PT End of Session - 07/02/23 1307     Visit Number 2    Date for PT Re-Evaluation 08/20/23    Authorization Type healthteam    Authorization - Visit Number 2    Authorization - Number of Visits 10    PT Start Time 1300    PT Stop Time 1345    PT Time Calculation (min) 45 min    Activity Tolerance Patient tolerated treatment well    Behavior During Therapy [facility] for tasks assessed/performed             Past Medical History:  Diagnosis Date   H/O tooth extraction 03/2017   Past Surgical History:  Procedure Laterality Date   CESAREAN SECTION     TOOTH EXTRACTION  03/2017   There are no problems to display for this patient.   PCP: Inez Pilgrim, NP  REFERRING PROVIDER: Selmer Dominion, NP   REFERRING DIAG:  R35.0 (ICD-10-CM) - Urinary frequency  N81.10 (ICD-10-CM) - Prolapse of anterior vaginal wall    THERAPY DIAG:  Muscle weakness (generalized)  Prolapse of anterior vaginal wall  Rationale for Evaluation and Treatment: Rehabilitation  ONSET DATE: 12/23  SUBJECTIVE:                                                                                                                                                                                           SUBJECTIVE STATEMENT: I feel better.  Fluid intake:  2 cups of coffee in AM, Prune juice, and 4-6 cups Water per day    PAIN:  Are you having pain? No   PRECAUTIONS: None  RED FLAGS: None   WEIGHT BEARING RESTRICTIONS: No  FALLS:  Has patient fallen in last 6 months? No  LIVING ENVIRONMENT: Lives with: lives alone  OCCUPATION: work from home; walk daily  PLOF: Independent  PATIENT GOALS: how to exercise and manage a prolapse.   PERTINENT HISTORY:  Cesarean section  BOWEL MOVEMENT:Managing stool with prunes and  fiber  URINATION: Pain with urination: No Fully empty bladder: When urinating, she feels dribbling after finishing  Stream: Strong Urgency: No Frequency: urinates 2-3 hours Leakage: Coughing  INTERCOURSE:Not active   PREGNANCY: C-section deliveries 1  PROLAPSE: Cystocele stage 1   OBJECTIVE:   DIAGNOSTIC FINDINGS:  Pelvic floor strength I/V  PVR of 91 ml was obtained by bladder scan.  Positive CST   COGNITION: Overall cognitive status: Within functional limits for tasks assessed     SENSATION:  Light touch: Appears intact Proprioception: Appears intact   POSTURE: No Significant postural limitations  PELVIC ALIGNMENT:  LUMBARAROM/PROM: full lumbar ROM   LOWER EXTREMITY ROM: full bilateral hip ROM   LOWER EXTREMITY MMT:  MMT Right eval Left eval  Hip extension 4/5 4/5   PALPATION:   General  lower abdominal scar are restricted, difficulty with contracting the lower abdominals.                 External Perineal Exam intact                             Internal Pelvic Floor tightness on the sides of the introitus  Patient confirms identification and approves PT to assess internal pelvic floor and treatment Yes  PELVIC MMT:   MMT eval  Vaginal 2/5 no hug of therapist finger and tightness on the lateral sides  (Blank rows = not tested)        TONE: average  PROLAPSE: Mild anterior wall weakness  TODAY'S TREATMENT:     07/02/23 Manual: Soft tissue mobilization: With leg off the mat working on the lateral abdominals to lengthen the tissue Scar tissue mobilization: Scar massage going through the restrictions and working around the umbilicus Using the suction cup to work on the scar with lifting up and using leg movement Educated patient on how to perform scar massage at home Exercises: Strengthening: Transverse abdominus contraction 10 x Supine ball squeeze with abdominal and pelvic floor contraction 10 x  Bridge with ball squeeze and pelvic  floor contraction 10 x Supine ball squeeze with abdominal and pelvic floor contraction shoulder flexion from 90 degrees Laying on side pressing top hand into ball and contract the abdominals and pelvic floor   PATIENT EDUCATION:  07/02/23 Education details: Access Code: [email or web address], scar massage Person educated: Patient Education method: Explanation, Demonstration, Tactile cues, Verbal cues, and Handouts Education comprehension: verbalized understanding, returned demonstration, verbal cues required, tactile cues required, and needs further education  HOME EXERCISE PROGRAM: 07/02/23 Access Code: [email or web address] URL: [email or web address] Date: 07/02/2023 Prepared by: Eulis Foster  Program Notes massage the pelvic floor with thumb in vaginal canal sweeping from 1 to 6 O'clock then 11 to 6 O'Clock 20 x each sideIf you have a painful area then hold 90 sec.   Exercises - Seated Diaphragmatic Breathing  - 1 x daily - 7 x weekly - 3 sets - 10 reps - Supine Diaphragmatic Breathing  - 1 x daily - 7 x weekly - 3 sets - 10 reps - Hooklying Isometric Hip Flexion  - 1 x daily - 3 x weekly - 1 sets - 10 reps - 3 sec hold - Hooklying Isometric Hip Flexion with Opposite Arm  - 1 x daily - 3 x weekly - 1 sets - 10 reps - 3 sec hold - Supine Alternating Shoulder Flexion  - 1 x daily - 3 x weekly - 1 sets - 10 reps - Supine Bridge with Mini Swiss Ball Between Knees  - 1 x daily - 3 x weekly - 1 sets - 10 reps - Hooklying Transversus Abdominis Palpation  - 1 x daily - 3 x weekly - 1 sets - 10 reps  ASSESSMENT:  CLINICAL IMPRESSION: Patient is a 67 y.o. female who was seen today for physical therapy  treatment for urinary frequency and anterior wall prolapse. Patient was able to contract the lower abdominals after the manual work. She had improved mobility of the  abdominal scar. She had tightness of the anterior trunk making it difficult to fully stretch the hip flexors.  Patient will benefit from  skilled therapy to improve lower abdominal strength, pelvic floor strength and education on prolapse management.   OBJECTIVE IMPAIRMENTS: decreased activity tolerance, decreased endurance, decreased strength, and increased fascial restrictions.   ACTIVITY LIMITATIONS: continence  PARTICIPATION LIMITATIONS:  exercise  PERSONAL FACTORS: Cesarean section are also affecting patient's functional outcome.   REHAB POTENTIAL: Excellent  CLINICAL DECISION MAKING: Stable/uncomplicated  EVALUATION COMPLEXITY: Low   GOALS: Goals reviewed with patient? Yes  SHORT TERM GOALS: Target date: 07/21/23  Patient is able to contract her lower abdominals with a pelvic floor contraction.  Baseline: Goal status: INITIAL  2.  Patient educated on scar mobilization to improve mobiltiy.  Baseline:  Goal status: INITIAL   LONG TERM GOALS: Target date: 08/20/23  Patient independent with advanced HEP for core and pelvic floor strength.  Baseline:  Goal status: INITIAL  2.  Patient is educated on prolapse management, pressure management to reduce chances of her prolapse progressing.  Baseline:  Goal status: INITIAL  3.  Patient understands on how to exercise at the gym without putting pressure on the prolapse.  Baseline:  Goal status: INITIAL  4.  Pelvic floor strength >/= 3/5 so she has not urinary leakage with coughing.  Baseline:  Goal status: INITIAL   PLAN:  PT FREQUENCY: 1x/week  PT DURATION: 8 weeks  PLANNED INTERVENTIONS: Therapeutic exercises, Therapeutic activity, Neuromuscular re-education, Patient/Family education, Joint mobilization, Dry Needling, Cryotherapy, Moist heat, Ultrasound, Biofeedback, and Manual therapy  PLAN FOR NEXT SESSION: manual work to the urogenital diaphragm and sides of introitus; manual work to scar, diaphragmatic breathing, core strength, hip flexor stretch   Eulis Foster, PT 07/02/23 1:59 PM

## 2023-07-09 ENCOUNTER — Encounter: Payer: PPO | Admitting: Physical Therapy

## 2023-07-09 ENCOUNTER — Encounter: Payer: Self-pay | Admitting: Physical Therapy

## 2023-07-09 DIAGNOSIS — R350 Frequency of micturition: Secondary | ICD-10-CM | POA: Diagnosis not present

## 2023-07-09 DIAGNOSIS — N8110 Cystocele, unspecified: Secondary | ICD-10-CM

## 2023-07-09 DIAGNOSIS — M6281 Muscle weakness (generalized): Secondary | ICD-10-CM

## 2023-07-09 NOTE — Therapy (Signed)
OUTPATIENT PHYSICAL THERAPY FEMALE PELVIC TREATMENT   Patient Name: Jodi Chambers MRN: [id number] DOB:[birth year], 67 y.o., female Today's Date: 07/09/2023  END OF SESSION:  PT End of Session - 07/09/23 1308     Visit Number 3    Date for PT Re-Evaluation 08/20/23    Authorization Type he[employer]    Authorization - Visit Number 3    Authorization - Number of Visits 10    PT Start Time 1300    PT Stop Time 1345    PT Time Calculation (min) 45 min    Activity Tolerance Patient tolerated treatment well    Behavior During Therapy [facility] for tasks assessed/performed             Past Medical History:  Diagnosis Date   H/O tooth extraction 03/2017   Past Surgical History:  Procedure Laterality Date   CESAREAN SECTION     TOOTH EXTRACTION  03/2017   There are no problems to display for this patient.   PCP: Inez Pilgrim, NP  REFERRING PROVIDER: Selmer Dominion, NP   REFERRING DIAG:  R35.0 (ICD-10-CM) - Urinary frequency  N81.10 (ICD-10-CM) - Prolapse of anterior vaginal wall    THERAPY DIAG:  Muscle weakness (generalized)  Prolapse of anterior vaginal wall  Rationale for Evaluation and Treatment: Rehabilitation  ONSET DATE: 12/23  SUBJECTIVE:                                                                                                                                                                                           SUBJECTIVE STATEMENT: My stomach has been a little tender so I have not done the massage as much. Dribbling after urination is less.  Fluid intake:  2 cups of coffee in AM, Prune juice, and 4-6 cups Water per day    PAIN:  Are you having pain? No   PRECAUTIONS: None  RED FLAGS: None   WEIGHT BEARING RESTRICTIONS: No  FALLS:  Has patient fallen in last 6 months? No  LIVING ENVIRONMENT: Lives with: lives alone  OCCUPATION: work from home; walk daily  PLOF: Independent  PATIENT GOALS: how to exercise and manage a  prolapse.   PERTINENT HISTORY:  Cesarean section  BOWEL MOVEMENT:Managing stool with prunes and fiber  URINATION: Pain with urination: No Fully empty bladder: When urinating, she feels dribbling after finishing  Stream: Strong Urgency: No Frequency: urinates 2-3 hours Leakage: Coughing  INTERCOURSE:Not active   PREGNANCY: C-section deliveries 1  PROLAPSE: Cystocele stage 1   OBJECTIVE:   DIAGNOSTIC FINDINGS:  Pelvic floor strength I/V  PVR of 91 ml was obtained by bladder scan.  Positive  CST   COGNITION: Overall cognitive status: Within functional limits for tasks assessed     SENSATION: Light touch: Appears intact Proprioception: Appears intact   POSTURE: No Significant postural limitations  PELVIC ALIGNMENT:  LUMBARAROM/PROM: full lumbar ROM   LOWER EXTREMITY ROM: full bilateral hip ROM   LOWER EXTREMITY MMT:  MMT Right eval Left eval  Hip extension 4/5 4/5   PALPATION:   General  lower abdominal scar are restricted, difficulty with contracting the lower abdominals.                 External Perineal Exam intact                             Internal Pelvic Floor tightness on the sides of the introitus  Patient confirms identification and approves PT to assess internal pelvic floor and treatment Yes  PELVIC MMT:   MMT eval 07/09/23  Vaginal 2/5 no hug of therapist finger and tightness on the lateral sides 3/5 with weak hug of therapist finger holding for 3 sec  (Blank rows = not tested)        TONE: average  PROLAPSE: Mild anterior wall weakness  TODAY'S TREATMENT:     07/09/23 Manual: Myofascial release: Fascial release of the urogenital diaphragm, along the labia and ischiocavernosus Internal pelvic floor techniques: No emotional/communication barriers or cognitive limitation. Patient is motivated to learn. Patient understands and agrees with treatment goals and plan. PT explains patient will be examined in standing, sitting, and  lying down to see how their muscles and joints work. When they are ready, they will be asked to remove their underwear so PT can examine their perineum. The patient is also given the option of providing their own chaperone as one is not provided in our facility. The patient also has the right and is explained the right to defer or refuse any part of the evaluation or treatment including the internal exam. With the patient's consent, PT will use one gloved finger to gently assess the muscles of the pelvic floor, seeing how well it contracts and relaxes and if there is muscle symmetry. After, the patient will get dressed and PT and patient will discuss exam findings and plan of care. PT and patient discuss plan of care, schedule, attendance policy and HEP activities.  Going through the vaginal canal working on the posterior vaginal wall, along the obturator internist and levator ani to relax the muscles Going through the vaginal canal working on the sides of the introitus Neuromuscular re-education: Pelvic floor contraction training: Therapist finger in the vaginal canal working on pelvic floor contraction with taping of the sides of the introitus Therapist finger in the vaginal canal giving the patient tactile cues to contract with marching 10 x than hip abduction using verbal cues too    07/02/23 Manual: Soft tissue mobilization: With leg off the mat working on the lateral abdominals to lengthen the tissue Scar tissue mobilization: Scar massage going through the restrictions and working around the umbilicus Using the suction cup to work on the scar with lifting up and using leg movement Educated patient on how to perform scar massage at home Exercises: Strengthening: Transverse abdominus contraction 10 x Supine ball squeeze with abdominal and pelvic floor contraction 10 x  Bridge with ball squeeze and pelvic floor contraction 10 x Supine ball squeeze with abdominal and pelvic floor contraction  shoulder flexion from 90 degrees Laying on side pressing top hand  into ball and contract the abdominals and pelvic floor   PATIENT EDUCATION:  07/09/23 Education details: Access Code: [email or web address], scar massage Person educated: Patient Education method: Explanation, Demonstration, Tactile cues, Verbal cues, and Handouts Education comprehension: verbalized understanding, returned demonstration, verbal cues required, tactile cues required, and needs further education  HOME EXERCISE PROGRAM: 07/09/23 Access Code: [email or web address] URL: [email or web address] Date: 07/09/2023 Prepared by: Eulis Foster  Program Notes massage the pelvic floor with thumb in vaginal canal sweeping from 1 to 6 O'clock then 11 to 6 O'Clock 20 x each sideIf you have a painful area then hold 90 sec.   Exercises - Seated Diaphragmatic Breathing  - 1 x daily - 7 x weekly - 3 sets - 10 reps - Supine Diaphragmatic Breathing  - 1 x daily - 7 x weekly - 3 sets - 10 reps - Supine Alternating Shoulder Flexion  - 1 x daily - 3 x weekly - 1 sets - 10 reps - Supine Bridge with Mini Swiss Ball Between Knees  - 1 x daily - 3 x weekly - 1 sets - 10 reps - Hooklying Transversus Abdominis Palpation  - 1 x daily - 3 x weekly - 1 sets - 10 reps - Hooklying Isometric Clamshell  - 1 x daily - 3 x weekly - 1 sets - 10 reps - Supine March with Resistance Band  - 1 x daily - 1 x weekly - 1 sets - 10 reps  ASSESSMENT:  CLINICAL IMPRESSION: Patient is a 67 y.o. female who was seen today for physical therapy  treatment for urinary frequency and anterior wall prolapse.   Pelvic floor strength increased to 3/5 with weak hug of therapist finger and held for 3 sec. She was able to engage the abdominals correctly with pelvic floor contraction. Patient will benefit from skilled therapy to improve lower abdominal strength, pelvic floor strength and education on prolapse management.   OBJECTIVE IMPAIRMENTS: decreased activity tolerance, decreased  endurance, decreased strength, and increased fascial restrictions.   ACTIVITY LIMITATIONS: continence  PARTICIPATION LIMITATIONS:  exercise  PERSONAL FACTORS: Cesarean section are also affecting patient's functional outcome.   REHAB POTENTIAL: Excellent  CLINICAL DECISION MAKING: Stable/uncomplicated  EVALUATION COMPLEXITY: Low   GOALS: Goals reviewed with patient? Yes  SHORT TERM GOALS: Target date: 07/21/23  Patient is able to contract her lower abdominals with a pelvic floor contraction.  Baseline: Goal status: Met 07/09/23  2.  Patient educated on scar mobilization to improve mobiltiy.  Baseline:  Goal status: Met 07/09/23   LONG TERM GOALS: Target date: 08/20/23  Patient independent with advanced HEP for core and pelvic floor strength.  Baseline:  Goal status: INITIAL  2.  Patient is educated on prolapse management, pressure management to reduce chances of her prolapse progressing.  Baseline:  Goal status: INITIAL  3.  Patient understands on how to exercise at the gym without putting pressure on the prolapse.  Baseline:  Goal status: INITIAL  4.  Pelvic floor strength >/= 3/5 so she has not urinary leakage with coughing.  Baseline:  Goal status: INITIAL   PLAN:  PT FREQUENCY: 1x/week  PT DURATION: 8 weeks  PLANNED INTERVENTIONS: Therapeutic exercises, Therapeutic activity, Neuromuscular re-education, Patient/Family education, Joint mobilization, Dry Needling, Cryotherapy, Moist heat, Ultrasound, Biofeedback, and Manual therapy  PLAN FOR NEXT SESSION: diaphragmatic breathing, core strength, hip flexor stretch, pressure management at gym   Eulis Foster, PT 07/09/23 1:58 PM

## 2023-07-16 ENCOUNTER — Encounter: Payer: PPO | Admitting: Physical Therapy

## 2023-07-28 ENCOUNTER — Encounter: Payer: Self-pay | Admitting: Physical Therapy

## 2023-07-28 ENCOUNTER — Encounter: Payer: PPO | Attending: Obstetrics and Gynecology | Admitting: Physical Therapy

## 2023-07-28 DIAGNOSIS — N8110 Cystocele, unspecified: Secondary | ICD-10-CM | POA: Diagnosis not present

## 2023-07-28 DIAGNOSIS — M6281 Muscle weakness (generalized): Secondary | ICD-10-CM | POA: Diagnosis not present

## 2023-07-28 NOTE — Therapy (Signed)
OUTPATIENT PHYSICAL THERAPY FEMALE PELVIC TREATMENT   Patient Name: Jodi Chambers MRN: [id number] DOB:[birth year], 67 y.o., female Today's Date: 07/28/2023  END OF SESSION:  PT End of Session - 07/28/23 1507     Visit Number 4    Date for PT Re-Evaluation 08/20/23    Authorization Type [employer]alt[employer]    Authorization - Visit Number 4    Authorization - Number of Visits 10    PT Start Time 1500    PT Stop Time 1545    PT Time Calculation (min) 45 min    Activity Tolerance Patient tolerated treatment well    Behavior During Therapy [facility] for tasks assessed/performed             Past Medical History:  Diagnosis Date   H/O tooth extraction 03/2017   Past Surgical History:  Procedure Laterality Date   CESAREAN SECTION     TOOTH EXTRACTION  03/2017   There are no problems to display for this patient.   PCP: Inez Pilgrim, NP  REFERRING PROVIDER: Selmer Dominion, NP   REFERRING DIAG:  R35.0 (ICD-10-CM) - Urinary frequency  N81.10 (ICD-10-CM) - Prolapse of anterior vaginal wall    THERAPY DIAG:  Muscle weakness (generalized)  Prolapse of anterior vaginal wall  Rationale for Evaluation and Treatment: Rehabilitation  ONSET DATE: 12/23  SUBJECTIVE:                                                                                                                                                                                           SUBJECTIVE STATEMENT: I am not having urinary leakage Chambers when I cough. Dribbling after urination has improved by 80%. Patient reports she does not feel pressure when she has to go to the bathroom.  Fluid intake:  2 cups of coffee in AM, Prune juice, and 4-6 cups Water per day    PAIN:  Are you having pain? No   PRECAUTIONS: None  RED FLAGS: None   WEIGHT BEARING RESTRICTIONS: No  FALLS:  Has patient fallen in last 6 months? No  LIVING ENVIRONMENT: Lives with: lives alone  OCCUPATION: work from home; walk daily  PLOF:  Independent  PATIENT GOALS: how to exercise and manage a prolapse.   PERTINENT HISTORY:  Cesarean section  BOWEL MOVEMENT:Managing stool with prunes and fiber  URINATION: Pain with urination: No Fully empty bladder: When urinating, she feels dribbling after finishing  Stream: Strong Urgency: No Frequency: urinates 2-3 hours Leakage: Coughing  INTERCOURSE:Not active   PREGNANCY: C-section deliveries 1  PROLAPSE: Cystocele stage 1   OBJECTIVE:   DIAGNOSTIC FINDINGS:  Pelvic floor strength I/V  PVR of 91 ml was obtained by bladder scan.  Positive CST   COGNITION: Overall cognitive status: Within functional limits for tasks assessed     SENSATION: Light touch: Appears intact Proprioception: Appears intact   POSTURE: No Significant postural limitations  PELVIC ALIGNMENT:  LUMBARAROM/PROM: full lumbar ROM   LOWER EXTREMITY ROM: full bilateral hip ROM   LOWER EXTREMITY MMT:  MMT Right eval Left eval Right/Left 07/28/23  Hip extension 4/5 4/5 5/5   PALPATION:   General  lower abdominal scar are restricted, difficulty with contracting the lower abdominals.                 External Perineal Exam intact                             Internal Pelvic Floor tightness on the sides of the introitus  Patient confirms identification and approves PT to assess internal pelvic floor and treatment Yes  PELVIC MMT:   MMT eval 07/09/23  Vaginal 2/5 no hug of therapist finger and tightness on the lateral sides 3/5 with weak hug of therapist finger holding for 3 sec  (Blank rows = not tested)        TONE: average  PROLAPSE: Mild anterior wall weakness  TODAY'S TREATMENT:     07/28/23 Exercises: Strengthening: Breathing when exercising  Keeping space between the rib cage and pubic bone, not flexing at the thoracic spine Breathing out with hardest part of exercise Scapula retraction and keeping shoulders down with arm exercises Supine clam with red  band Supine alternate shoulder flexion with red band in supine Bridge with ball squeeze   07/09/23 Manual: Myofascial release: Fascial release of the urogenital diaphragm, along the labia and ischiocavernosus Internal pelvic floor techniques: No emotional/communication barriers or cognitive limitation. Patient is motivated to learn. Patient understands and agrees with treatment goals and plan. PT explains patient will be examined in standing, sitting, and lying down to see how their muscles and joints work. When they are ready, they will be asked to remove their underwear so PT can examine their perineum. The patient is also given the option of providing their own chaperone as one is not provided in our facility. The patient also has the right and is explained the right to defer or refuse any part of the evaluation or treatment including the internal exam. With the patient's consent, PT will use one gloved finger to gently assess the muscles of the pelvic floor, seeing how well it contracts and relaxes and if there is muscle symmetry. After, the patient will get dressed and PT and patient will discuss exam findings and plan of care. PT and patient discuss plan of care, schedule, attendance policy and HEP activities.  Going through the vaginal canal working on the posterior vaginal wall, along the obturator internist and levator ani to relax the muscles Going through the vaginal canal working on the sides of the introitus Neuromuscular re-education: Pelvic floor contraction training: Therapist finger in the vaginal canal working on pelvic floor contraction with taping of the sides of the introitus Therapist finger in the vaginal canal giving the patient tactile cues to contract with marching 10 x than hip abduction using verbal cues too    07/02/23 Manual: Soft tissue mobilization: With leg off the mat working on the lateral abdominals to lengthen the tissue Scar tissue mobilization: Scar massage  going through the restrictions and working around the umbilicus Using the suction  cup to work on the scar with lifting up and using leg movement Educated patient on how to perform scar massage at home Exercises: Strengthening: Transverse abdominus contraction 10 x Supine ball squeeze with abdominal and pelvic floor contraction 10 x  Bridge with ball squeeze and pelvic floor contraction 10 x Supine ball squeeze with abdominal and pelvic floor contraction shoulder flexion from 90 degrees Laying on side pressing top hand into ball and contract the abdominals and pelvic floor   PATIENT EDUCATION:  07/09/23 Education details: Access Code: [email or web address], scar massage Person educated: Patient Education method: Explanation, Demonstration, Tactile cues, Verbal cues, and Handouts Education comprehension: verbalized understanding, returned demonstration, verbal cues required, tactile cues required, and needs further education  HOME EXERCISE PROGRAM: 07/09/23 Access Code: [email or web address] URL: [email or web address] Date: 07/09/2023 Prepared by: Eulis Foster  Program Notes massage the pelvic floor with thumb in vaginal canal sweeping from 1 to 6 O'clock then 11 to 6 O'Clock 20 x each sideIf you have a painful area then hold 90 sec.   Exercises - Seated Diaphragmatic Breathing  - 1 x daily - 7 x weekly - 3 sets - 10 reps - Supine Diaphragmatic Breathing  - 1 x daily - 7 x weekly - 3 sets - 10 reps - Supine Alternating Shoulder Flexion  - 1 x daily - 3 x weekly - 1 sets - 10 reps - Supine Bridge with Mini Swiss Ball Between Knees  - 1 x daily - 3 x weekly - 1 sets - 10 reps - Hooklying Transversus Abdominis Palpation  - 1 x daily - 3 x weekly - 1 sets - 10 reps - Hooklying Isometric Clamshell  - 1 x daily - 3 x weekly - 1 sets - 10 reps - Supine March with Resistance Band  - 1 x daily - 1 x weekly - 1 sets - 10 reps  ASSESSMENT:  CLINICAL IMPRESSION: Patient is a 67 y.o. female who was seen  today for physical therapy  treatment for urinary frequency and anterior wall prolapse. Dribbling after urination has improved by 80%. No urinary leakage since last visit.  Patient will walk 6 days per week and does an incline. Pelvic floor strength is 3/5. She understands how to exercise without putting pressure on the pelvic floor. She understands to breath out with hardest part of exercise. She is independent with her HEP and has met her goals.   OBJECTIVE IMPAIRMENTS: decreased activity tolerance, decreased endurance, decreased strength, and increased fascial restrictions.   ACTIVITY LIMITATIONS: continence  PARTICIPATION LIMITATIONS:  exercise  PERSONAL FACTORS: Cesarean section are also affecting patient's functional outcome.   REHAB POTENTIAL: Excellent  CLINICAL DECISION MAKING: Stable/uncomplicated  EVALUATION COMPLEXITY: Low   GOALS: Goals reviewed with patient? Yes  SHORT TERM GOALS: Target date: 07/21/23  Patient is able to contract her lower abdominals with a pelvic floor contraction.  Baseline: Goal status: Met 07/09/23  2.  Patient educated on scar mobilization to improve mobiltiy.  Baseline:  Goal status: Met 07/09/23   LONG TERM GOALS: Target date: 08/20/23  Patient independent with advanced HEP for core and pelvic floor strength.  Baseline:  Goal status: Met 07/28/23  2.  Patient is educated on prolapse management, pressure management to reduce chances of her prolapse progressing.  Baseline:  Goal status: Met 07/28/23  3.  Patient understands on how to exercise at the gym without putting pressure on the prolapse.  Baseline:  Goal status: Met 07/28/23  4.  Pelvic floor strength >/= 3/5 so  she has not urinary leakage with coughing.  Baseline:  Goal status: Met 07/28/23   PLAN: Patient is being discharged at this visit.     Eulis Foster, PT 07/28/23 3:07 PM  PHYSICAL THERAPY DISCHARGE SUMMARY  Visits from Start of Care: 4  Current functional level  related to goals / functional outcomes: See above.    Remaining deficits: See above.    Education / Equipment: HEP   Patient agrees to discharge. Patient goals were met. Patient is being discharged due to meeting the stated rehab goals. Thank you for the referral.    Eulis Foster, PT 07/28/23 3:45 PM

## 2023-09-24 ENCOUNTER — Encounter: Payer: PPO | Admitting: Physical Therapy

## 2023-10-01 ENCOUNTER — Encounter: Payer: PPO | Admitting: Physical Therapy
